# Patient Record
Sex: Female | Born: 2005 | Race: White | Hispanic: No | Marital: Single | Smoking: Never smoker
Health system: Southern US, Community
[De-identification: ages and names within clinical notes are randomized; demographics above are authoritative.]

---
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
## 2024-05-01 ENCOUNTER — Ambulatory Visit: Payer: Self-pay

## 2024-05-01 NOTE — Telephone Encounter (Signed)
 FYI Only or Action Required?: FYI only for provider: appointment scheduled on 06/11/24.  Patient was last seen in primary care on New Patient.  Called Nurse Triage reporting Weight Loss.  Symptoms began about a month ago.  Interventions attempted: Nothing.  Symptoms are: stable.  Triage Disposition: See PCP When Office is Open (Within 3 Days)  Patient/caregiver understands and will follow disposition?: Yes         Copied from CRM #[id number]. Topic: Clinical - Red Word Triage >> May 01, 2024  3:29 PM Deleta RAMAN wrote: Red Word that prompted transfer to Nurse Triage: patient has being dropping weight due to stress looking to establish care Reason for Disposition  MODERATE unexplained weight loss (e.g., 5%, 8 to 10 pounds [4 - 4.5 kg] in person who weighs 170 to 200 pounds [75 - 90 kg])  Answer Assessment - Initial Assessment Questions 1. MAIN CONCERN: What is your main concern today?     Weight loss   2. WEIGHT LOSS: How much weight have you lost?  (e.g., lbs., kgs.)  Over what period of time have you lost this weight?  (e.g., number of days, weeks, months, years)     15-20 lbs current weight is 129  3. BASELINE WEIGHT: What is your baseline or normal weight? (e.g., How much do you usually weigh?)     145-150  4. CAUSE: What do you think is causing the weight loss? (e.g., depression, anxiety, medicine side effect, pain, trouble swallowing, substance or alcohol use problem, eating disorder)      Stress of exams, loss of appetite    5. PRIOR EVALUATION: Have you been evaluated by a doctor for your weight loss? If Yes, ask When was your last visit? What did your doctor (or NP/PA) tell you about the possible cause?  No   7. OTHER SYMPTOMS: Do you have any other symptoms? (e.g., anxiety or depression, blood in stool, breathing difficulty, diarrhea, fever, trouble swallowing)     Anxiety from school, currently taking Lexapro   8. PREGNANCY: Is there any  chance you are pregnant? When was your last menstrual period?     No.    Patient called in to triage with complaints of weight loss, anxiety from school This has been ongoing for 1-2 months. The patient stated for the anxiety she is taking Lexapro as prescribed by psychiatrist..  New Patient Appointment scheduled for 06/11/24; Patient agrees with the plan of care, and will reach out if symptoms worsen or persist.  Protocols used: Weight Loss - Unintended-A-AH

## 2024-06-08 ENCOUNTER — Telehealth: Payer: Self-pay

## 2024-06-08 NOTE — Telephone Encounter (Signed)
 Called patient but no answer and LMTCB to discuss. Please relay to patient options for appointment:  Options for Monday 06/11/24:   Patient can keep scheduled appt but will need to be virtual as we will still see patients from home. We can not do CPEs on this day; NP appt only.   2. Patient can r/s to another day open.   Please let us  know if appt will be virtual so we can switch appt over.

## 2024-06-11 ENCOUNTER — Ambulatory Visit: Payer: Self-pay | Admitting: General Practice

## 2024-06-11 NOTE — Telephone Encounter (Signed)
 Patient moved to 07/19/24

## 2024-07-19 ENCOUNTER — Ambulatory Visit: Payer: Self-pay | Admitting: General Practice
